# Patient Record
Sex: Female | Born: 1975 | Race: White | Hispanic: No | Marital: Single | Smoking: Current every day smoker
Health system: Southern US, Academic
[De-identification: ages and names within clinical notes are randomized; demographics above are authoritative.]

## PROBLEM LIST (undated history)

## (undated) DIAGNOSIS — J449 Chronic obstructive pulmonary disease, unspecified: Secondary | ICD-10-CM

## (undated) DIAGNOSIS — K219 Gastro-esophageal reflux disease without esophagitis: Secondary | ICD-10-CM

## (undated) HISTORY — DX: Gastro-esophageal reflux disease without esophagitis: K21.9

## (undated) HISTORY — DX: Chronic obstructive pulmonary disease, unspecified (CMS HCC): J44.9

---

## 2020-07-29 ENCOUNTER — Ambulatory Visit (INDEPENDENT_AMBULATORY_CARE_PROVIDER_SITE_OTHER): Payer: 59 | Admitting: Internal Medicine

## 2020-08-01 ENCOUNTER — Other Ambulatory Visit: Payer: Self-pay

## 2020-08-01 ENCOUNTER — Ambulatory Visit (INDEPENDENT_AMBULATORY_CARE_PROVIDER_SITE_OTHER): Payer: 59 | Admitting: Internal Medicine

## 2020-08-01 ENCOUNTER — Encounter (INDEPENDENT_AMBULATORY_CARE_PROVIDER_SITE_OTHER): Payer: Self-pay | Admitting: Internal Medicine

## 2020-08-01 VITALS — BP 132/84 | HR 101 | Temp 97.9°F | Ht 62.0 in | Wt 144.2 lb

## 2020-08-01 DIAGNOSIS — Z7689 Persons encountering health services in other specified circumstances: Secondary | ICD-10-CM

## 2020-08-01 DIAGNOSIS — Z716 Tobacco abuse counseling: Secondary | ICD-10-CM

## 2020-08-01 DIAGNOSIS — R9431 Abnormal electrocardiogram [ECG] [EKG]: Secondary | ICD-10-CM

## 2020-08-01 DIAGNOSIS — F5104 Psychophysiologic insomnia: Secondary | ICD-10-CM

## 2020-08-01 NOTE — Patient Instructions (Signed)
How to Quit Smoking  Smoking is a hard habit to break. About half of allpeople who have ever smoked have been able to quit. Most peoplewho still smoke want to quit. Here are some of the best ways to stop smoking.    Keep in mind the health benefits of quitting  The health benefits of quitting start right away. They keep improving the longer you go without smoking. Knowing this can help inspire you to stay on track. These benefits occur at any age. If you are 17 or 70, quitting is a good choice. Some of the health benefits after your last cigarette include:   20 minutes: Your blood pressure and pulse return to normal.   8 hours: Your oxygen levels return to normal.   2 days: Your ability to smell and taste start to improve as damaged nerves regrow.   2 to 3 weeks: Your circulation and lung function improve.   1 to 9 months: Your coughing, congestion, and shortness of breath decrease. Your tiredness decreases.   1 year: Your risk of heart attack decreases by half.   5 years: Your risk of lung cancer decreases by half. Your risk of stroke becomes the same as a nonsmoker's.  Go cold turkey  Mostformer smokers quit cold turkey. This means stopping all at once. Trying to cut back slowly often doesn't work as well. This may be because it continues the habit of smoking. Also, you may inhale more smoke while smoking fewer cigarettes. This leads to the same amount of nicotine in your body.  Get support  Support programs can be a big help, especially for heavy smokers. These groups offer lectures, ways to change behavior, and peer support. Here are some ways to find a support program:   Free national quitline 800-QUIT-NOW (800-784-8669)   Hospital quit-smoking programs   American Lung Association 800-586-4872   American Cancer Society 800-227-2345  Support at home is important too. Family and friends can offer praise and reassurance. If the smoker in your life finds it hard to quit, encourage them to keep  trying.  Try over-the-counter medicine  Nicotine replacement therapymay make iteasier to quit. Some aids are available without a prescription. These include a nicotine patch, gum, and lozenges. But itis best to use these under the care of your healthcare provider. The skin patch gives a steady supply of nicotine. Nicotine gum and lozenges giveshort-time doses of low levels of nicotine. Both methods reduce the craving for cigarettes. If youhave nausea, vomiting, dizziness, weakness, or a fast heartbeat, stop using these products. See your healthcare provider.  Ask about prescription medicine  After reviewingyour smoking patterns and past attempts to quit, your doctor may offer a prescription medicine such as bupropion, varenicline, a nicotine inhaler, or nasal spray. Each has advantages and side effects. Your doctor can review these with you.  Keep trying  Most smokers make many attempts at quitting before they are successful. It's important not to give up.  For more information  For more on how to quit smoking, try these online resources:   Go to Smokefree.gov.   Read "Clearing the Air" from the National Cancer Institute at smokefree.gov/sites/default/files/pdf/clearing-the-air-accessible.pdf.  Date Last Reviewed:    2000-2019 [employer] [address], [place] [address]. All rights reserved. This information is not intended as a substitute for professional medical care. Always follow your healthcare professional's instructions.

## 2020-08-01 NOTE — Progress Notes (Signed)
INTERNAL MEDICINE, [facility]  [place] [address]  [facility]     Name: Breanna Edwards MRN:  [id number]   Date: 08/01/2020 Age: 45 y.o.           OUTPATIENT PROGRESS NOTE    Subjective:   Patient ID:  [name] is a pleasant 45 y.o. female.    Chief Complaint: Establish Care, Insomnia, and Abnormal EKG      History of Present Illness:    Patient is here to establish care, new to [facility].  Previous primary care provider Dr. Helene Kelp [facility]).  Will request previous medical records and review upon receiving.     Abnormal EKG, found within the last month.  Patient denies chest pain or palpitations.  She states that the EKG was a screening test that was done prior to employment.  She reports that with the abnormality, her new employer requires that she gets additional testing done prior to starting her job training.  She states no prior history of heart disease, no heart attacks.  She also does have a history of breast implants and is wondering if this may be contributing to the abnormalities found on EKG    Insomnia  Chronic for over a year  Acute worsening over the last month or so regarding upcoming new employment and getting approval.  Currently on trazodone, tolerating well without significant side effects  Moderate severity  Trouble falling asleep  Improved by trazodone typically    She does smoke cigarettes, is currently smoking half pack a day and is trying to cut back.    The history is provided by the patient    Denies chest pain, palpitations, shortness of breath, wheezing      Allergies:     Allergies   Allergen Reactions    Aleve [Naproxen]     Flu Vaccine Qs2014-15(37mo,Up)     Pain Medicine [Diphenhydramine-Acetaminophen]      Any pain medicine       Pneumococcal Vaccine          Medications:   pantoprazole (PROTONIX) 40 mg Oral Tablet, Delayed Release (E.C.), Take 40 mg by mouth Once a day  traZODone (DESYREL) 100 mg Oral  Tablet, Take 100 mg by mouth    No facility-administered medications prior to visit.        Immunization History:     There is no immunization history on file for this patient.      Past Medical/Social/Family/Surgical History:       Past Medical History:   Diagnosis Date    Chronic obstructive airway disease (CMS HCC)     'beginning stages'    Esophageal reflux          Past Surgical History:   Procedure Laterality Date    HX BREAST AUGMENTATION      HX CHOLECYSTECTOMY           Family Medical History:     Problem Relation (Age of Onset)    Alzheimer's/Dementia Father    Congestive Heart Failure Maternal Grandmother    Diabetes Father    Heart Attack Father, Maternal Grandmother    Hypertension (High Blood Pressure) Father, Maternal Grandmother    Parkinsons Disease Father    Seizures Mother          Social History     Socioeconomic History    Marital status: Single   Tobacco Use    Smoking status: Current Every Day Smoker  Packs/day: 1.00     Years: 20.00     Pack years: 20.00     Types: Cigarettes    Smokeless tobacco: Never Used    Tobacco comment: cutting back (1/4 ppd as of 08/01/20)   Vaping Use    Vaping Use: Never used   Substance and Sexual Activity    Alcohol use: Yes     Comment: socially    Drug use: Never    Sexual activity: Not Currently     Partners: Male               Review of Systems:   cardiac: no chest pain, palpitations  Pulmonary: no cough or shortness of breath at rest  All other systems reviewed and negative unless otherwise noted in HPI.      Objective:   Vitals:    Vitals:    08/01/20 1501   BP: 132/84   Pulse: (!) 101   Temp: 36.6 C (97.9 F)   SpO2: 97%   Weight: 65.4 kg (144 lb 2.9 oz)   Height: 1.575 m (5\' 2" )   BMI: 26.43          Body mass index is 26.37 kg/m.    Wt Readings from Last 6 Encounters:   08/01/20 65.4 kg (144 lb 2.9 oz)         Physical Exam  Vitals and nursing note reviewed.   Constitutional:       General: She is not in acute distress.     Appearance:  She is not ill-appearing or diaphoretic.   HENT:      Head: Normocephalic and atraumatic.      Right Ear: External ear normal.      Left Ear: External ear normal.   Eyes:      General: No scleral icterus.        Right eye: No discharge.         Left eye: No discharge.      Extraocular Movements: Extraocular movements intact.   Neck:      Thyroid: No thyromegaly.      Vascular: No carotid bruit.   Cardiovascular:      Rate and Rhythm: Normal rate and regular rhythm.      Heart sounds: Normal heart sounds. No murmur heard.  Pulmonary:      Effort: Pulmonary effort is normal. No respiratory distress.      Breath sounds: Normal breath sounds. No wheezing.   Abdominal:      General: There is no distension.      Palpations: Abdomen is soft. There is no mass.      Tenderness: There is no abdominal tenderness. There is no guarding.   Musculoskeletal:         General: No swelling or signs of injury.      Cervical back: Neck supple.      Right lower leg: No edema.      Left lower leg: No edema.   Skin:     General: Skin is warm and dry.      Coloration: Skin is not jaundiced or pale.   Neurological:      Mental Status: She is alert.      Motor: No abnormal muscle tone.   Psychiatric:         Attention and Perception: She is attentive.         Mood and Affect: Mood is not depressed or elated. Affect is not blunt or flat.  Speech: Speech is not rapid and pressured.         Behavior: Behavior normal.             Assessment & Plan:     1. Encounter to establish care  Patient is here to establish care, new to [facility].  Previous primary care provider Dr. Helene Kelp [facility]).  Will request previous medical records and review upon receiving.    2. Abnormal EKG  Acute, no chest pain.  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 interval 118 milliseconds, QTC 389 milliseconds, T-wave abnormalities, not consistent with ACS  - TRANSTHORACIC ECHOCARDIOGRAM - ADULT; Future  - Refer to [facility]; Future    3. Psychophysiological insomnia  Acute on chronic, continue current medication, anxiety likely secondary to issues with pre-employment testing.  These will be resolved prior to the end of the month, continue to monitor for now    4. Encounter for tobacco use cessation counseling  Discussed the health risks of tobacco use with patient, including but not limited to: cancer, death, stroke, heart disease  If smoking cigarettes, discussed the health risks of second hand smoke and third hand smoke with patient, this does not apply to chewing tobacco  We discussed cessation and options to help with cessation: medications (chantix), tapering, abrupt cessation  We discussed the timeframe of quitting:  within 1-2 months, ASAP  Time spent 4 minutes    Patient understands all points discussed        For all new medications prescribed on this visit, side effects, alternatives, risks and benefits were discussed and all patient's questions were answered.            Health Maintenance   Topic Date Due    Covid-19 Vaccine (1) Never done    Pneumococcal Vaccine, Age 20-64 based on risk (1 of 2 - PPSV23) Never done    HIV Screening  Never done    Adult Tdap-Td (1 - Tdap) Never done    Pap smear  Never done    Colonoscopy  Never done    Influenza Vaccine (1) Never done    Depression Screening  08/01/2021       Return in about 4 months (around 12/01/2020), or if symptoms worsen or fail to improve.    The patient has been educated and verbalized understanding regarding the services provided during this visit.        Janalee Dane, DO  08/01/2020, 15:34    The date and time stamp above reflect the action of opening and starting the note and do not  reflect the visit start time, end time or visit length.    This note was created using dictation software.  It was not reviewed for spelling or grammar in errors.

## 2020-08-02 ENCOUNTER — Ambulatory Visit (INDEPENDENT_AMBULATORY_CARE_PROVIDER_SITE_OTHER): Payer: 59 | Admitting: CARDIOVASCULAR DISEASE

## 2020-08-02 ENCOUNTER — Encounter (INDEPENDENT_AMBULATORY_CARE_PROVIDER_SITE_OTHER): Payer: Self-pay | Admitting: Internal Medicine

## 2020-08-02 ENCOUNTER — Inpatient Hospital Stay (INDEPENDENT_AMBULATORY_CARE_PROVIDER_SITE_OTHER)
Admission: RE | Admit: 2020-08-02 | Discharge: 2020-08-02 | Disposition: A | Discharge status: Home / Self Care | Payer: 59 | Source: Ambulatory Visit

## 2020-08-02 ENCOUNTER — Encounter (INDEPENDENT_AMBULATORY_CARE_PROVIDER_SITE_OTHER): Payer: Self-pay | Admitting: CARDIOVASCULAR DISEASE

## 2020-08-02 ENCOUNTER — Other Ambulatory Visit: Payer: Self-pay

## 2020-08-02 DIAGNOSIS — R9431 Abnormal electrocardiogram [ECG] [EKG]: Secondary | ICD-10-CM

## 2020-08-02 LAB — TRANSTHORACIC ECHOCARDIOGRAM - ADULT
AV LVOT peak gradient: 5 mmHg
Ao peak vel: 142 cm/s
Aortic Valve Area by Continuity of Peak Velocity: 2.73 cm2
Aortic Valve Area by Continuity of VTI: 2.44 cm2
E wave decelartion time: 183 ms
E/A ratio: 1.7
Inferior Vena Cava Diameter: 1.1 cm
Inferior Vena Cava Diameter: 1.1 cm
LA Volume Index: 21.7 ml/m2
LA volume: 32.8 cm3
LVOT diameter: 2.1 cm
LVOT stroke volume: 78 cm3
Left Ventricular Outflow Tract Peak Velocity: 112 cm/s
MV Comp VTI: 26.2 cm
MV Peak A Vel: 44.1 cm/s
MV Peak E Vel: 76.6 cm/s
MV mean gradient: 2 mmHg
MV stenosis pressure 1/2 time: 54 ms
Mitral Valve Max Velocity: 96.1 cm/s
Pulmonic Valve Acceleration Time: 134 ms
RVDD: 2.5 cm
RVDD: 2.5 cm

## 2020-08-02 NOTE — Progress Notes (Signed)
[place]  [address]  [place] [address]  [facility]  Progress Note    Name: Breanna Edwards MRN:  [id number]   Date: 08/02/2020 Age: 45 y.o. Dec 17, 1975      Chief Complaint            Abnormal EKG           History of Present illness:  Breanna Edwards is a 45 y.o. female with a history of tobacco use well on the way to stopping without pharmacological intervention at this time had a new employee physical which included an EKG that had an abnormality presumably poor R-wave progression across her precordium due to previous breast implants.  An echocardiogram has been obtained today which was physically reviewed by myself and shows no abnormalities and is consistent with a normal exam for age.  Patient has no history of current or previous ischemic arrhythmic or congestive cardiovascular symptoms.  She has 2 brothers neither of which have heart disease.  The patient has never had diabetes hypertension or dyslipidemia.  She feels to be in very good health  There are no problems to display for this patient.       Past Medical History  Current Outpatient Medications   Medication Sig   . pantoprazole (PROTONIX) 40 mg Oral Tablet, Delayed Release (E.C.) Take 40 mg by mouth Once a day   . traZODone (DESYREL) 100 mg Oral Tablet Take 100 mg by mouth     Allergies   Allergen Reactions   . Aleve [Naproxen]    . Flu Vaccine Qs2014-15(17mo,Up)    . Pain Medicine [Diphenhydramine-Acetaminophen]      Any pain medicine      . Pneumococcal Vaccine      Past Medical History:   Diagnosis Date   . Chronic obstructive airway disease (CMS [facility])     'beginning stages'   . Esophageal reflux           Past Surgical History:   Procedure Laterality Date   . HX BREAST AUGMENTATION     . HX CHOLECYSTECTOMY           Family Medical History:     Problem Relation (Age of Onset)    Alzheimer's/Dementia Father    Congestive Heart Failure Maternal Grandmother    Diabetes  Father    Heart Attack Father, Maternal Grandmother    Hypertension (High Blood Pressure) Father, Maternal Grandmother    Parkinsons Disease Father    Seizures Mother          Social History     Socioeconomic History   . Marital status: Single   Tobacco Use   . Smoking status: Current Every Day Smoker     Packs/day: 0.25     Years: 20.00     Pack years: 5.00     Types: Cigarettes   . Smokeless tobacco: Never Used   . Tobacco comment: cutting back (1/4 ppd as of 08/01/20)   Vaping Use   . Vaping Use: Never used   Substance and Sexual Activity   . Alcohol use: Yes     Comment: socially   . Drug use: Never   . Sexual activity: Not Currently     Partners: Male        Review of Systems:  Constitutional: negative for fevers, chills, fatigue, or unintentional weight loss  Eyes: negative for visual disturbance, irritation or redness  Ears, nose, mouth, throat, and face:  negative for hearing loss, tinnitus or hoarseness  Respiratory: negative for cough, wheezing, or shortness of breath  Cardiovascular:   negative for chest pain, palpitations, or DVT  Gastrointestinal: negative for nausea, vomiting, diarrhea, or difficulty swallowing  Hematologic/lymphatic: negative for easy bruising or bleeding  Musculoskeletal:negative for myalgias, arthralgias or muscle weakness  Neurological: negative for headaches, seizures or speech problems; negative history of TIA or CVA  Behavioral/Psych: negative for sleep disturbance or anorexia  Endocrine: negative for temperature intolerance or excessive sweating  Allergic/Immunologic: negative for urticaria or angioedema      Physical Exam:  BP 112/64   Pulse 70   Temp 36.2 C (97.2 F) (Thermal Scan)   Ht 1.575 m (5\' 2" )   Wt 64.2 kg (141 lb 8.6 oz)   SpO2 98%   BMI 25.89 kg/m        GENERAL: Patient is alert oriented to time, place and person. No acute distress  HEENT:  Head is normocephalic, atraumatic.  Extraocular motions are intact.  Sclerae are nonicteric.   NECK:  Supple, without  JVD or carotid bruits.   LUNGS:  Breath sounds are clear to auscultation bilaterally.   CARDIOVASCULAR:  Reveals a regular rate and rhythm with normal S1 and S2. No murmur, rub or gallop present  ABDOMEN:  Soft and non-distended with no visceromegaly.  LOWER EXTREMITIES:  Appear well-perfused without edema.    Lab Results as reviewed by me today:  COMPLETE BLOOD COUNT   No results found for: WBC, HGB, HCT, PLTCNT, BANDS    DIFFERENTIAL  No results found for: PMNS, LYMPHOCYTES, MYELOCYTES, MONOCYTES, EOSINOPHIL, BASOPHILS, NRBCS, PMNABS, LYMPHSABS, EOSABS, MONOSABS, BASOSABS     COMPREHENSIVE METABOLIC PANEL - NON FASTING  No results found for: SODIUM, POTASSIUM, CHLORIDE, CO2, ANIONGAP, BUN, CREATININE, GLUCOSENF, CALCIUM, PHOSPHORUS, ALBUMIN, TOTALPROTEIN, ALKPHOS, AST, ALT, BILIRUBINCON    No results found for: TRIG, HDLCHOL, LDLCHOL, CHOLESTEROL  Lab A1C Results:  No results found for: HA1C    POCT A1C Results:  No flowsheet data found.    No results found for: POCTHA1C     TROPONIN I  No results found for: UHCEASTTROPI, TROPONINI       No results found for this or any previous visit (from the past [id number] hour(s)).     No results found for this or any previous visit (from the past [id number] hour(s)).     Cardiovascular Testing:    Assessment and Plan:    ICD-10-CM    1. Abnormal EKG  R94.31 Refer to [facility]        No orders of the defined types were placed in this encounter.      The patient who is stopping tobacco use does not appear to require any further cardiac testing at this time.  I re-emphasized the need to stop smoking.  Return if any symptoms develop.     Rolinda Roan, MD

## 2020-08-05 ENCOUNTER — Ambulatory Visit (INDEPENDENT_AMBULATORY_CARE_PROVIDER_SITE_OTHER): Payer: Self-pay | Admitting: Internal Medicine

## 2020-08-05 LAB — ECG 12 LEAD - (AMB USE ONLY)(MUSE, IN CLINIC)
Atrial Rate: 76 {beats}/min
Calculated P Axis: 35 degrees
Calculated R Axis: 59 degrees
Calculated T Axis: 55 degrees
PR Interval: 118 ms
QRS Duration: 82 ms
QT Interval: 346 ms
QTC Calculation: 389 ms
Ventricular rate: 76 {beats}/min

## 2020-08-05 NOTE — Telephone Encounter (Addendum)
I called the patient to see about the message below. She states that the head of HR for her new job was supposed to fax [place] forms for Dr. Haydee Monica to fill out. I informed her that we had not received anything gave her our fax number. Patient was going to request that her employer refax.    Salem Senate, RN  08/05/2020, 15:39      Regarding: call pt  ----- Message from Teodora Medici sent at 08/05/2020  3:21 PM EDT -----  [email or web address]    Pt asking if EKG was faxed

## 2020-08-08 ENCOUNTER — Encounter (INDEPENDENT_AMBULATORY_CARE_PROVIDER_SITE_OTHER): Payer: Self-pay | Admitting: Internal Medicine

## 2020-08-09 ENCOUNTER — Telehealth (INDEPENDENT_AMBULATORY_CARE_PROVIDER_SITE_OTHER): Payer: Self-pay | Admitting: Internal Medicine

## 2020-08-09 NOTE — Nursing Note (Signed)
Form completed. Should be in the fax bin.     Dr. Loletha Grayer         Form has been faxed to [phone] per pt request.  Orlinda Blalock, [facility]  08/09/2020, 13:59

## 2020-08-09 NOTE — Telephone Encounter (Signed)
-----   Message from Janalee Dane, DO sent at 08/09/2020  1:31 PM EDT -----  Regarding: RE: Employment document   Form completed. Should be in the fax bin.    Dr. Loletha Grayer  ----- Message -----  From: Orlinda Blalock, [place]: 08/08/2020   2:38 PM EDT  To: Janalee Dane, DO  Subject: FW: Employment document                          Form printed and on your desk.  Orlinda Blalock, [facility]  08/08/2020, 14:38    ----- Message -----  From: Ilona Sorrel  Sent: 08/08/2020   2:18 PM EDT  To: [facility] Clinical Support Staff  Subject: Employment document                              Please see attached form to be filled out. It is to be sent to [facility]. His fax is [phone]  Thank you so very much.

## 2020-08-10 ENCOUNTER — Encounter (INDEPENDENT_AMBULATORY_CARE_PROVIDER_SITE_OTHER): Payer: Self-pay

## 2020-12-01 ENCOUNTER — Encounter (INDEPENDENT_AMBULATORY_CARE_PROVIDER_SITE_OTHER): Payer: Self-pay | Admitting: Internal Medicine

## 2020-12-20 ENCOUNTER — Encounter (INDEPENDENT_AMBULATORY_CARE_PROVIDER_SITE_OTHER): Payer: Self-pay | Admitting: Internal Medicine

## 2020-12-20 ENCOUNTER — Ambulatory Visit (INDEPENDENT_AMBULATORY_CARE_PROVIDER_SITE_OTHER): Payer: 59 | Admitting: Internal Medicine

## 2020-12-20 ENCOUNTER — Other Ambulatory Visit: Payer: Self-pay

## 2020-12-20 VITALS — BP 128/77 | HR 84 | Temp 98.2°F | Ht 62.0 in | Wt 157.8 lb

## 2020-12-20 DIAGNOSIS — K219 Gastro-esophageal reflux disease without esophagitis: Secondary | ICD-10-CM

## 2020-12-20 DIAGNOSIS — Z716 Tobacco abuse counseling: Secondary | ICD-10-CM

## 2020-12-20 DIAGNOSIS — D229 Melanocytic nevi, unspecified: Secondary | ICD-10-CM

## 2020-12-20 DIAGNOSIS — G4700 Insomnia, unspecified: Secondary | ICD-10-CM

## 2020-12-20 DIAGNOSIS — F17210 Nicotine dependence, cigarettes, uncomplicated: Secondary | ICD-10-CM

## 2020-12-20 MED ORDER — NICOTINE 7 MG/24 HR DAILY TRANSDERMAL PATCH
7.0000 mg | MEDICATED_PATCH | Freq: Every day | TRANSDERMAL | 0 refills | Status: AC
Start: 2020-12-20 — End: 2021-01-19

## 2020-12-20 MED ORDER — BUPROPION HCL XL 150 MG 24 HR TABLET, EXTENDED RELEASE
ORAL_TABLET | ORAL | 0 refills | Status: AC
Start: 2020-12-20 — End: 2021-02-02

## 2020-12-20 MED ORDER — NICOTINE 14 MG/24 HR DAILY TRANSDERMAL PATCH
14.0000 mg | MEDICATED_PATCH | Freq: Every day | TRANSDERMAL | 0 refills | Status: AC
Start: 2020-12-20 — End: 2021-01-19

## 2020-12-20 MED ORDER — PANTOPRAZOLE 40 MG TABLET,DELAYED RELEASE
40.0000 mg | DELAYED_RELEASE_TABLET | Freq: Every day | ORAL | 3 refills | Status: AC
Start: 2020-12-20 — End: ?

## 2020-12-20 MED ORDER — TRAZODONE 100 MG TABLET
100.0000 mg | ORAL_TABLET | Freq: Every evening | ORAL | 3 refills | Status: AC
Start: 2020-12-20 — End: ?

## 2020-12-20 NOTE — Patient Instructions (Signed)
How to Quit Smoking  Smoking is a hard habit to break. About half of allpeople who have ever smoked have been able to quit. Most peoplewho still smoke want to quit. Here are some of the best ways to stop smoking.    Keep in mind the health benefits of quitting  The health benefits of quitting start right away. They keep improving the longer you go without smoking. Knowing this can help inspire you to stay on track. These benefits occur at any age. If you are 17 or 70, quitting is a good choice. Some of the health benefits after your last cigarette include:   20 minutes: Your blood pressure and pulse return to normal.   8 hours: Your oxygen levels return to normal.   2 days: Your ability to smell and taste start to improve as damaged nerves regrow.   2 to 3 weeks: Your circulation and lung function improve.   1 to 9 months: Your coughing, congestion, and shortness of breath decrease. Your tiredness decreases.   1 year: Your risk of heart attack decreases by half.   5 years: Your risk of lung cancer decreases by half. Your risk of stroke becomes the same as a nonsmoker's.  Go cold turkey  Mostformer smokers quit cold turkey. This means stopping all at once. Trying to cut back slowly often doesn't work as well. This may be because it continues the habit of smoking. Also, you may inhale more smoke while smoking fewer cigarettes. This leads to the same amount of nicotine in your body.  Get support  Support programs can be a big help, especially for heavy smokers. These groups offer lectures, ways to change behavior, and peer support. Here are some ways to find a support program:   Free national quitline 800-QUIT-NOW (800-784-8669)   Hospital quit-smoking programs   American Lung Association 800-586-4872   American Cancer Society 800-227-2345  Support at home is important too. Family and friends can offer praise and reassurance. If the smoker in your life finds it hard to quit, encourage them to keep  trying.  Try over-the-counter medicine  Nicotine replacement therapymay make iteasier to quit. Some aids are available without a prescription. These include a nicotine patch, gum, and lozenges. But itis best to use these under the care of your healthcare provider. The skin patch gives a steady supply of nicotine. Nicotine gum and lozenges giveshort-time doses of low levels of nicotine. Both methods reduce the craving for cigarettes. If youhave nausea, vomiting, dizziness, weakness, or a fast heartbeat, stop using these products. See your healthcare provider.  Ask about prescription medicine  After reviewingyour smoking patterns and past attempts to quit, your doctor may offer a prescription medicine such as bupropion, varenicline, a nicotine inhaler, or nasal spray. Each has advantages and side effects. Your doctor can review these with you.  Keep trying  Most smokers make many attempts at quitting before they are successful. It's important not to give up.  For more information  For more on how to quit smoking, try these online resources:   Go to Smokefree.gov.   Read "Clearing the Air" from the National Cancer Institute at smokefree.gov/sites/default/files/pdf/clearing-the-air-accessible.pdf.  Date Last Reviewed:    2000-2019 [employer] [address], [place] [address]. All rights reserved. This information is not intended as a substitute for professional medical care. Always follow your healthcare professional's instructions.

## 2020-12-20 NOTE — Progress Notes (Signed)
INTERNAL MEDICINE, [facility]  [place] [address]  [facility]     Name: Breanna Edwards MRN:  [id number]   Date: 12/20/2020 Age: 45 y.o.           OUTPATIENT PROGRESS NOTE    Subjective:   Patient ID:  [name] is a pleasant 45 y.o. female.    Chief Complaint: Follow Up      History of Present Illness:  Patient presents for follow-up on GERD, insomnia, skin mole and tobacco use cessation counseling    GERD - chronic for over a year  Well controlled on current medications  No acute exacerbation  Worse with eating late at night, spicy foods  Improved by sitting up after meals, avoiding triggering foods  Compliant with medications    Insomnia  Chronic >1 year  Well controlled on trazodone, tolerating without side effects    Skin mole  Right upper chest   Not painful  Has been growing slowly over 10 years per patients mother  No recent rapid growth  Not itching  Improved by nothing    She would like to quit smoking  Currently smoking 1 pack per day  In the past she had tried Chantix which made her emotionally unstable    The history is provided by the patient and mother    Denies chest pain, palpitations, shortness of breath, wheezing      Allergies:     Allergies   Allergen Reactions   . Aleve [Naproxen]    . Flu Vaccine Qs2014-15(45moUp)    . Pain Medicine [Diphenhydramine-Acetaminophen]      Any pain medicine      . Pneumococcal Vaccine          Medications:   pantoprazole (PROTONIX) 40 mg Oral Tablet, Delayed Release (E.C.), Take 40 mg by mouth Once a day  traZODone (DESYREL) 100 mg Oral Tablet, Take 100 mg by mouth    No facility-administered medications prior to visit.        Immunization History:     There is no immunization history on file for this patient.      Past Medical/Social/Family/Surgical History:       Past Medical History:   Diagnosis Date   . [facility]     '[facility]   . Esophageal reflux          Past  Surgical History:   Procedure Laterality Date   . HX BREAST AUGMENTATION     . HX CHOLECYSTECTOMY           Family Medical History:     Problem Relation (Age of Onset)    Alzheimer's/Dementia Father    Congestive Heart Failure Maternal Grandmother    Diabetes Father    Heart Attack Father, Maternal Grandmother    Hypertension (High Blood Pressure) Father, Maternal Grandmother    Parkinsons Disease Father    Seizures Mother          Social History     Socioeconomic History   . Marital status: Single   Tobacco Use   . Smoking status: Current Every Day Smoker     Packs/day: 0.25     Years: 20.00     Pack years: 5.00     Types: Cigarettes   . Smokeless tobacco: Never Used   . Tobacco comment: cutting back (1/4 ppd as of 08/01/20)   Vaping Use   . Vaping Use: Never used  Substance and Sexual Activity   . Alcohol use: Yes     Comment: socially   . Drug use: Never   . Sexual activity: Not Currently     Partners: Male               Review of Systems:   cardiac: no chest pain, palpitations  Pulmonary: no cough or shortness of breath at rest  All other systems reviewed and negative unless otherwise noted in HPI.      Objective:   Vitals:    Vitals:    12/20/20 1502   BP: 128/77   Pulse: 84   Temp: 36.8 C (98.2 F)   SpO2: 99%   Weight: 71.6 kg (157 lb 13.6 oz)   Height: 1.575 m ('5\' 2"'$ )   BMI: 28.93          Body mass index is 28.87 kg/m.    Wt Readings from Last 6 Encounters:   12/20/20 71.6 kg (157 lb 13.6 oz)   08/02/20 64.2 kg (141 lb 8.6 oz)   08/01/20 65.4 kg (144 lb 2.9 oz)         Physical Exam  Vitals and nursing note reviewed.   Constitutional:       General: She is not in acute distress.     Appearance: She is not ill-appearing, toxic-appearing or diaphoretic.   Eyes:      General: No scleral icterus.        Right eye: No discharge.         Left eye: No discharge.      Extraocular Movements: Extraocular movements intact.   Cardiovascular:      Rate and Rhythm: Normal rate and regular rhythm.      Heart sounds:  Normal heart sounds. No murmur heard.  Pulmonary:      Effort: Pulmonary effort is normal. No respiratory distress.      Breath sounds: Normal breath sounds. No wheezing.   Abdominal:      General: Bowel sounds are normal. There is no distension.      Palpations: Abdomen is soft.      Tenderness: There is no abdominal tenderness.   Skin:     General: Skin is warm and dry.      Findings: No erythema or rash.          Neurological:      Mental Status: She is alert.   Psychiatric:         Attention and Perception: She is attentive.         Mood and Affect: Mood is not anxious or depressed.         Speech: She is communicative.         Behavior: Behavior is not agitated.         Cognition and Memory: Cognition is not impaired.             Assessment & Plan:     1. Gastroesophageal reflux disease, unspecified whether esophagitis present  Chronic and stable  Continue current medication  Continue to monitor  - pantoprazole (PROTONIX) 40 mg Oral Tablet, Delayed Release (E.C.); Take 1 Tablet (40 mg total) by mouth Once a day  Dispense: 90 Tablet; Refill: 3    2. Insomnia, unspecified type  Chronic and stable  Continue current medication  Continue to monitor  - traZODone (DESYREL) 100 mg Oral Tablet; Take 1 Tablet (100 mg total) by mouth Every night  Dispense: 90 Tablet; Refill: 3  3. Benign skin mole  Chronic, slow growth, appears benign.  Continue to monitor.    4. Encounter for tobacco use cessation counseling  Discussed the health risks of tobacco use with patient, including but not limited to: cancer, death, stroke, heart disease  If smoking cigarettes, discussed the health risks of second hand smoke and third hand smoke with patient, this does not apply to chewing tobacco  We discussed cessation and options to help with cessation: medications (chantix), tapering, abrupt cessation  We discussed the timeframe of quitting:  within 1-2 months, ASAP  Time spent 11 minutes    Patient understands all points discussed    -  buPROPion (WELLBUTRIN XL) 150 mg extended release 24 hr tablet; Take 1 Tablet (150 mg total) by mouth Once a day for 14 days, THEN 2 Tablets (300 mg total) Once a day for 30 days.  Dispense: 74 Tablet; Refill: 0  - nicotine (NICODERM CQ) 14 mg/24 hr Transdermal Patch 24 hr; Place 1 Patch (14 mg total) on the skin Once a day for 30 days  Dispense: 30 Patch; Refill: 0  - nicotine (NICODERM CQ) 7 mg/24 hr Transdermal Patch 24 hr; Place 1 Patch (7 mg total) on the skin Once a day for 30 days  Dispense: 30 Patch; Refill: 0      For all new medications prescribed on this visit, side effects, alternatives, risks and benefits were discussed and all patient's questions were answered.            Health Maintenance   Topic Date Due   . Covid-19 Vaccine (1) Never done   . Pneumococcal Vaccine, Age 72-64 (1 - PCV) Never done   . HIV Screening  Never done   . Adult Tdap-Td (1 - Tdap) Never done   . Pap smear  Never done   . Colonoscopy  Never done   . Influenza Vaccine (1) 01/19/2021   . Depression Screening  08/01/2021   . Meningococcal Vaccine  Aged Out       Return in about 6 months (around 06/22/2021), or if symptoms worsen or fail to improve.    The patient has been educated and verbalized understanding regarding the services provided during this visit.        Janalee Dane, DO  12/20/2020, 15:09    The date and time stamp above reflect the action of opening and starting the note and do not reflect the visit start time, end time or visit length.    This note was created using dictation software.  It was not reviewed for spelling or grammar in errors.

## 2021-06-23 ENCOUNTER — Encounter (INDEPENDENT_AMBULATORY_CARE_PROVIDER_SITE_OTHER): Payer: Self-pay | Admitting: Internal Medicine

## 2021-07-25 ENCOUNTER — Telehealth: Payer: 59 | Admitting: Internal Medicine

## 2021-07-25 ENCOUNTER — Encounter (INDEPENDENT_AMBULATORY_CARE_PROVIDER_SITE_OTHER): Payer: Self-pay | Admitting: Internal Medicine

## 2021-07-25 DIAGNOSIS — A084 Viral intestinal infection, unspecified: Secondary | ICD-10-CM

## 2021-07-25 MED ORDER — ONDANSETRON 8 MG DISINTEGRATING TABLET
8.0000 mg | ORAL_TABLET | Freq: Three times a day (TID) | ORAL | 0 refills | Status: AC | PRN
Start: 2021-07-25 — End: ?

## 2021-07-25 NOTE — Progress Notes (Signed)
INTERNAL MEDICINE, [facility]  [address]  [place] [address]  [facility]  Video Visit     Name: Breanna Edwards  MRN: [id number]    Date: 07/25/2021  Age: 46 y.o.                            Patient's location: [place] [address]   Patient/family aware of provider location: Yes  Patient/family consent for video visit: Yes  Interview and observation performed by: Janalee Dane, DO    Chief Complaint: Nausea and Diarrhea    History of Present Illness:  Breanna Edwards is a 46 y.o. female acute issue of nausea and diarrhea    Nausea with vomiting and diarrhea started yesterday  Severe intensity  Felt hot, did not check temperature  Improved by nothing  sharp abdominal pain, diffuse, pain max 6/10  no new medications   No blood in stools    HPI per patient    Past Medical History:  She has a past medical history of Chronic obstructive airway disease (CMS HCC) and Esophageal reflux.    Past Surgical History:  She has a past surgical history that includes hx breast augmentation and hx gall bladder surgery/chole.    Problem List:  She has Esophageal reflux and Insomnia on their problem list.    Medications:  .  buPROPion  .  ondansetron  .  pantoprazole  .  traZODone     Review of Systems:  Cardiology no chest pain or palpitations  Pulmonary no wheezing or shortness of breath the rest  Full review of systems completed and negative unless otherwise noted in HPI      Observational Exam:   There were no vitals filed for this visit.        Vital signs are patient reported with the exception of respiratory rate which was observed.    Physical Exam  Constitutional:       General: She is not in acute distress.     Appearance: She is not ill-appearing, toxic-appearing or diaphoretic.   HENT:      Head: Normocephalic and atraumatic. Not macrocephalic and not microcephalic. No raccoon eyes, right periorbital erythema or left periorbital erythema.      Right Ear: External ear normal.       Left Ear: External ear normal.   Eyes:      General: No scleral icterus.        Right eye: No discharge.         Left eye: No discharge.      Extraocular Movements: Extraocular movements intact.   Neck:      Vascular: No JVD.      Trachea: No tracheal deviation.      Comments: No visible goiter  Pulmonary:      Effort: Pulmonary effort is normal. No respiratory distress.   Musculoskeletal:      Cervical back: Normal range of motion.   Skin:     Coloration: Skin is not jaundiced or pale.      Findings: No erythema, petechiae or rash.   Neurological:      Mental Status: She is alert.      Cranial Nerves: No dysarthria or facial asymmetry.      Motor: No tremor.   Psychiatric:         Attention and Perception: She is attentive.         Mood and Affect:  Mood is not anxious or depressed. Affect is not flat or inappropriate.         Behavior: Behavior is not agitated.         Judgment: Judgment is not impulsive.           Data Reviewed:   Last office note    Assessment/Plan:  1. Viral gastroenteritis  Acute, not improving, new prescription as below, continue to monitor  Rec OTC pedialyte and immodium  If pain increases to 8/10 or higher, proceed to the ER for immediate assessment  Work note provided  - ondansetron (ZOFRAN ODT) 8 mg Oral Tablet, Rapid Dissolve; Place 1 Tablet (8 mg total) under the tongue Every 8 hours as needed for Nausea/Vomiting  Dispense: 30 Tablet; Refill: 0        ICD-10-CM    1. Viral gastroenteritis  A08.4 ondansetron (ZOFRAN ODT) 8 mg Oral Tablet, Rapid Dissolve        Orders Placed This Encounter   . ondansetron (ZOFRAN ODT) 8 mg Oral Tablet, Rapid Dissolve     Follow Up:  PRN    Total time spent during direct conference with patient 5 mins, this does not reflect entire time spent during the encounter including but not limited to chart review and documentation. Had difficulties with audio, we connected via phone call for audio purposes as well      Janalee Dane, DO

## 2021-07-27 ENCOUNTER — Ambulatory Visit (INDEPENDENT_AMBULATORY_CARE_PROVIDER_SITE_OTHER): Payer: Self-pay | Admitting: Internal Medicine

## 2021-07-27 ENCOUNTER — Telehealth: Payer: 59 | Admitting: Internal Medicine

## 2021-07-27 VITALS — Resp 14 | Ht 62.0 in | Wt 157.0 lb

## 2021-07-27 DIAGNOSIS — R300 Dysuria: Secondary | ICD-10-CM

## 2021-07-27 MED ORDER — PHENAZOPYRIDINE 200 MG TABLET
200.0000 mg | ORAL_TABLET | Freq: Three times a day (TID) | ORAL | 0 refills | Status: AC
Start: 2021-07-27 — End: 2021-07-29

## 2021-07-27 NOTE — Telephone Encounter (Signed)
Pt agreeable to video visit for UTI symptoms. Scheduled for 1630.    Georgina Quint, RN  07/27/2021, 16:16

## 2021-07-27 NOTE — Telephone Encounter (Signed)
I left a vm for the patient to return my call and schedule a video visit with Dr. Haydee Monica for UTI symptoms.     Georgina Quint, RN  07/27/2021, 12:00

## 2021-07-27 NOTE — Telephone Encounter (Signed)
Regarding: return call  ----- Message from [facility] sent at 07/27/2021  4:02 PM EST -----  Georgina Quint, RN     Pt is return call

## 2021-07-27 NOTE — Telephone Encounter (Signed)
Regarding: rx  ----- Message from Lilia Argue sent at 07/27/2021  3:50 PM EST -----  Janalee Dane, DO    Pt already had video visit with Dr    Needs UTI antibiotic send to [facility] now    Please call to advise    Preferred Pharmacy     [id number] [place] [facility] [address] [place] [id number]   Phone: [phone]: [phone]   Hours: Not open 24 hours

## 2021-07-27 NOTE — Telephone Encounter (Signed)
I left a vm for the patient to return my call regarding need for video visit for UTI symptoms.     Pt had video visit on 3/7 for gastroenteritis. UTI symptoms not addressed during that visit. New video visit will need scheduled for this issue.     Georgina Quint, RN  07/27/2021, 15:57

## 2021-07-27 NOTE — Telephone Encounter (Signed)
Regarding: rx request  ----- Message from Lanetta Inch sent at 07/27/2021 10:47 AM EST -----  Janalee Dane, DO        Pt requesting an antibiotic for her UTI, symptoms - burning, pressure.  Please call the patient to advise.    Preferred Pharmacy     [id number] [place] [facility] [address]    [address] [place] [address]   Phone: [phone]: [phone]   Hours: Not open 24 hours

## 2021-07-28 ENCOUNTER — Other Ambulatory Visit: Payer: Self-pay

## 2021-07-28 ENCOUNTER — Ambulatory Visit: Payer: 59 | Attending: Internal Medicine | Admitting: Internal Medicine

## 2021-07-28 ENCOUNTER — Encounter (INDEPENDENT_AMBULATORY_CARE_PROVIDER_SITE_OTHER): Payer: Self-pay | Admitting: Internal Medicine

## 2021-07-28 ENCOUNTER — Other Ambulatory Visit (INDEPENDENT_AMBULATORY_CARE_PROVIDER_SITE_OTHER): Payer: 59

## 2021-07-28 DIAGNOSIS — R300 Dysuria: Secondary | ICD-10-CM | POA: Insufficient documentation

## 2021-07-28 NOTE — Progress Notes (Signed)
INTERNAL MEDICINE, [facility]  [address]  [place] [address]  [facility]  Video Visit     Name: Breanna Edwards  MRN: [id number]    Date: 07/27/2021  Age: 46 y.o.                            Patient's location: [place] [address]   Patient/family aware of provider location: Yes  Patient/family consent for video visit: Yes  Interview and observation performed by: Janalee Dane, DO    Chief Complaint: Bladder Pain    History of Present Illness:  Breanna Edwards is a 46 y.o. female     Patient presents with acute complaints of urinary symptoms    Symptoms started approx 1 day ago  Dysuria is burning sensation, moderate severity  Pain is located at the urethra and lower abdomen  Increased frequency and bladder pressure  Denies hematuria  Worsened by nothing  She has minimal access to a vehicle, states she is able to come in for testing tomorrow afternoon    HPI Per patient     Past Medical History:  She has a past medical history of Chronic obstructive airway disease (CMS HCC) and Esophageal reflux.    Past Surgical History:  She has a past surgical history that includes hx breast augmentation and hx gall bladder surgery/chole.    Problem List:  She has Esophageal reflux and Insomnia on their problem list.    Medications:  .  buPROPion  .  ondansetron  .  pantoprazole  .  phenazopyridine  .  traZODone     Review of Systems:  Cardiology no chest pain or palpitations  Pulmonary no wheezing or shortness of breath the rest  Full review of systems completed and negative unless otherwise noted in HPI    Observational Exam:   Vitals:    07/27/21 1624   Resp: 14   Weight: 71.2 kg (157 lb)   Height: 1.575 m ('5\' 2"'$ )   BMI: 28.78     Vital signs are patient reported with the exception of respiratory rate which was observed.    Physical Exam  Constitutional:       General: She is not in acute distress.     Appearance: She is not ill-appearing, toxic-appearing or diaphoretic.    HENT:      Head: Normocephalic and atraumatic. Not macrocephalic and not microcephalic. No raccoon eyes, right periorbital erythema or left periorbital erythema.      Right Ear: External ear normal.      Left Ear: External ear normal.   Eyes:      General: No scleral icterus.        Right eye: No discharge.         Left eye: No discharge.      Extraocular Movements: Extraocular movements intact.   Neck:      Vascular: No JVD.      Trachea: No tracheal deviation.      Comments: No visible goiter  Pulmonary:      Effort: Pulmonary effort is normal.   Musculoskeletal:      Cervical back: Normal range of motion.   Skin:     Coloration: Skin is not jaundiced or pale.      Findings: No erythema, petechiae or rash.   Neurological:      Mental Status: She is alert.      Cranial Nerves: No dysarthria or  facial asymmetry.      Motor: No tremor.   Psychiatric:         Attention and Perception: She is attentive.         Mood and Affect: Mood is not anxious or depressed. Affect is not flat or inappropriate.         Behavior: Behavior is not agitated.         Judgment: Judgment is not impulsive.           Data Reviewed:   Last office visit    Assessment/Plan:  1. Dysuria  Acute, not improving  Awaiting testing ordered as below  - POCT Urine dipstick  - URINE CULTURE; Future        ICD-10-CM    1. Dysuria  R30.0 POCT Urine dipstick     URINE CULTURE        Orders Placed This Encounter   . URINE CULTURE   . POCT Urine dipstick   . phenazopyridine (PYRIDIUM) 200 mg Oral Tablet     Follow Up:  PRN    Will establish with [facility] at follow up visit.          Janalee Dane, DO

## 2021-07-30 LAB — URINE CULTURE: URINE CULTURE: >100000 — AB

## 2021-07-31 ENCOUNTER — Other Ambulatory Visit (INDEPENDENT_AMBULATORY_CARE_PROVIDER_SITE_OTHER): Payer: Self-pay | Admitting: Internal Medicine

## 2021-07-31 ENCOUNTER — Encounter (INDEPENDENT_AMBULATORY_CARE_PROVIDER_SITE_OTHER): Payer: Self-pay | Admitting: Internal Medicine

## 2021-07-31 DIAGNOSIS — N390 Urinary tract infection, site not specified: Secondary | ICD-10-CM

## 2021-07-31 MED ORDER — SULFAMETHOXAZOLE 800 MG-TRIMETHOPRIM 160 MG TABLET
1.0000 | ORAL_TABLET | Freq: Two times a day (BID) | ORAL | 0 refills | Status: AC
Start: 2021-07-31 — End: 2021-08-03

## 2021-12-15 ENCOUNTER — Telehealth (INDEPENDENT_AMBULATORY_CARE_PROVIDER_SITE_OTHER): Payer: Self-pay | Admitting: Family

## 2021-12-15 NOTE — Telephone Encounter (Signed)
I attempted to call the patient to do a transition of care and see if she wanted to establish with one of our other providers such as Francee Gentile or Marzetta Merino with our [facility] Medicine department. I left her a vm to return our call. Salem Senate, RN, 12/15/2021 16:18

## 2022-04-19 IMAGING — MR ARTHROGRAM MRI LT SHOULDER
5 series · 40 of 40 positions shown · IV contrast (multihance)
Comparison: none

﻿Pertinent Hx:    Injury 02/06/2022.  Shoulder pain.
TECHNIQUE: Using sterile technique and local anesthesia and with fluoroscopic guidance, a 20-gauge needle was placed into the shoulder.  Approximately 15 cc of a dilute mixture of MultiHance mixed in normal saline were injected into the shoulder along with a small amount of [id number].  Fluoro time:  0.2 min. 

T1 and T2 weighted coronal oblique, T1 weighted sagittal oblique, and T1 and T2 weighted axial images of the shoulder were taken.

[Series 3: T1 · axial · 4.0mm · 0.50mm/px · z∈[-50,+53]mm · 8 of 22 slices shown]
[im 1/22]
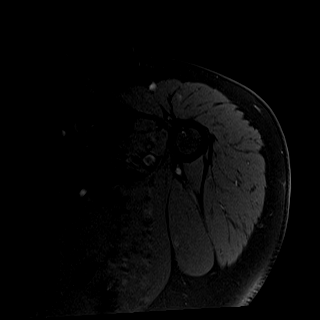
[im 4/22]
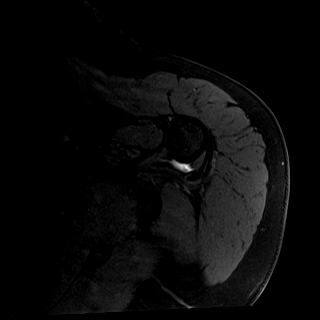
[im 7/22]
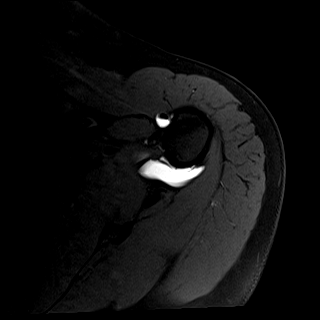
[im 10/22]
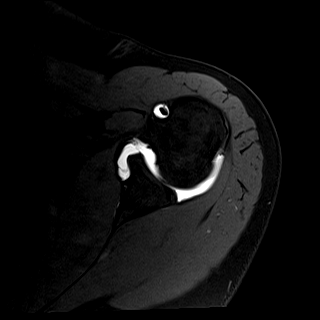
[im 13/22]
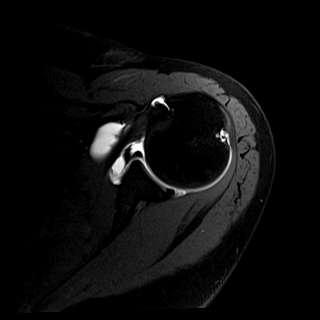
[im 16/22]
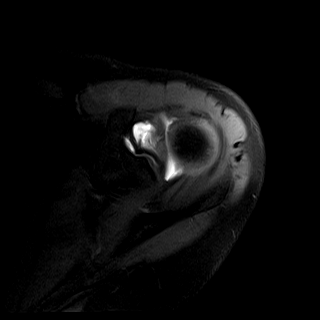
[im 19/22]
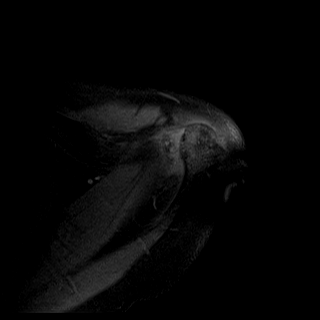
[im 22/22]
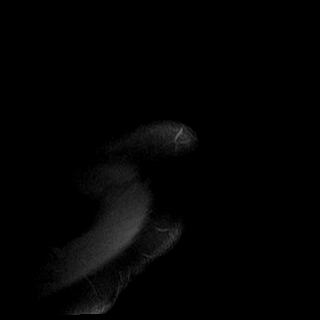

[Series 4: T1 fat-sat · oblique · 5.0mm · 0.50mm/px · 7 of 19 slices shown (1 of 2)]
[im 1/19]
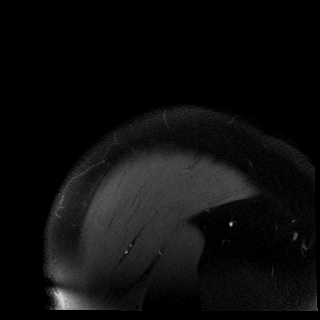
[im 4/19]
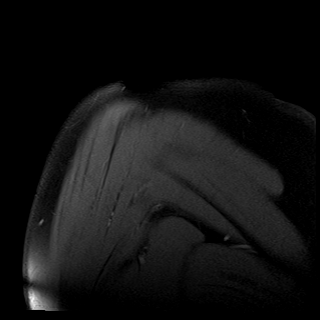
[im 7/19]
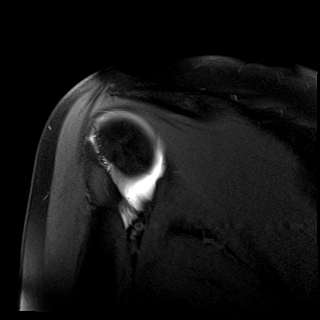
[im 10/19]
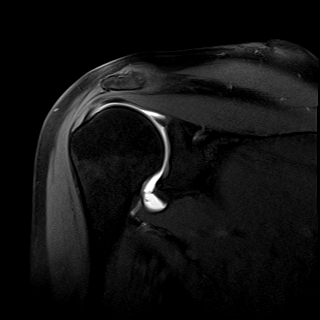
[im 13/19]
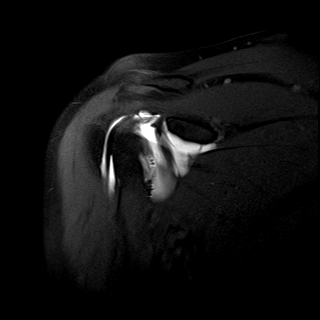
[im 16/19]
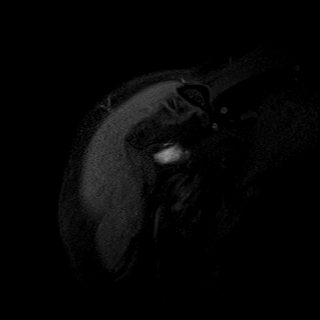
[im 19/19]
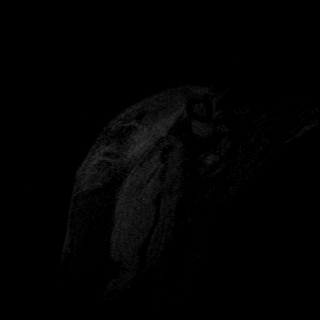

[Series 5: T2 fat-sat · oblique · 5.0mm · 0.50mm/px · 8 of 19 slices shown]
[im 1/19]
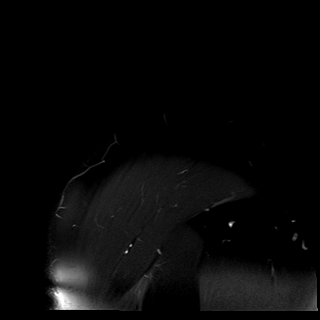
[im 3/19]
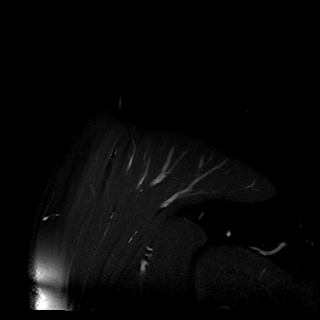
[im 6/19]
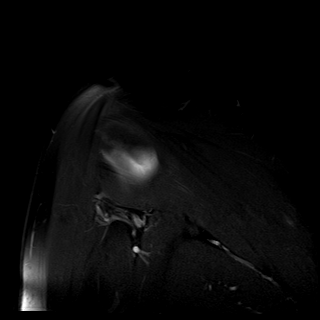
[im 8/19]
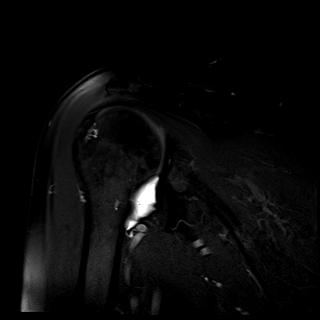
[im 11/19]
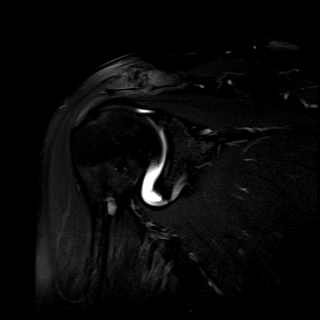
[im 13/19]
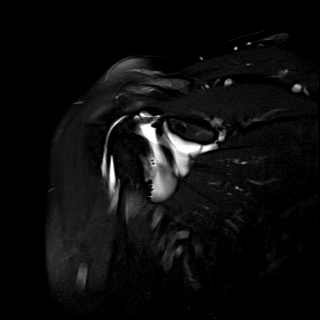
[im 16/19]
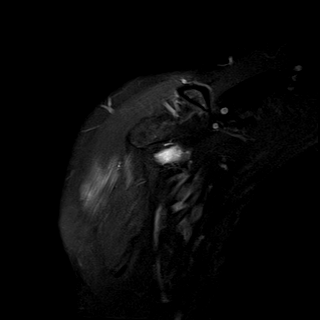
[im 19/19]
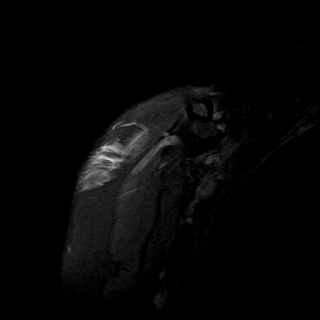

[Series 6: T2 · axial · 4.0mm · 0.50mm/px · z∈[-50,+53]mm · 9 of 22 slices shown]
[im 1/22]
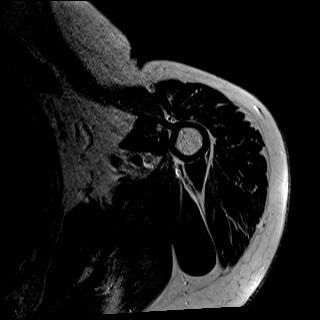
[im 3/22]
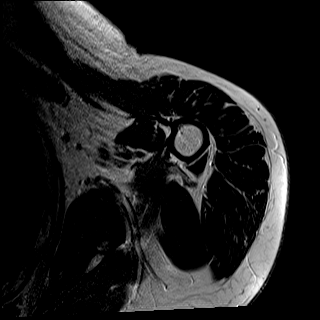
[im 6/22]
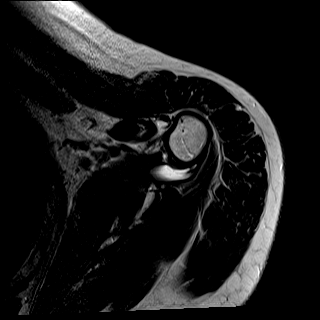
[im 8/22]
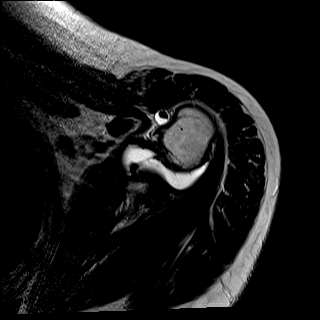
[im 11/22]
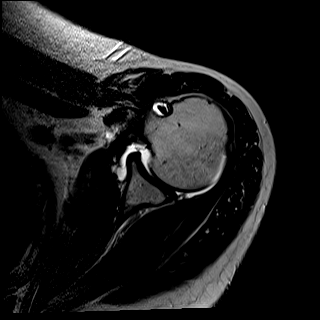
[im 14/22]
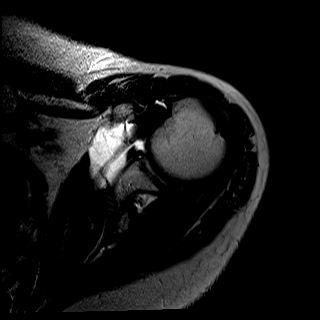
[im 16/22]
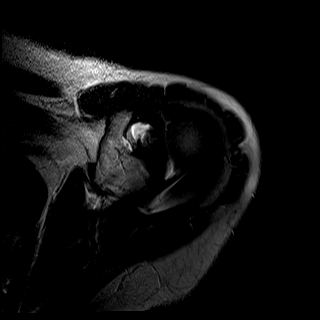
[im 19/22]
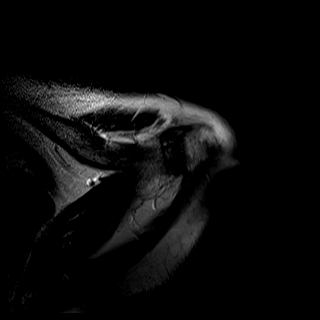
[im 22/22]
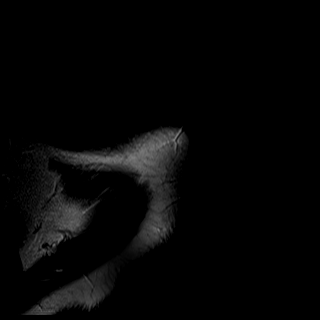

[Series 7: T1 fat-sat · oblique · 4.0mm · 0.50mm/px · 8 of 19 slices shown (2 of 2)]
[im 1/19]
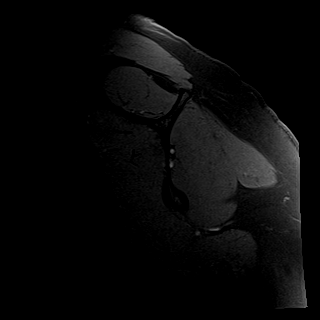
[im 3/19]
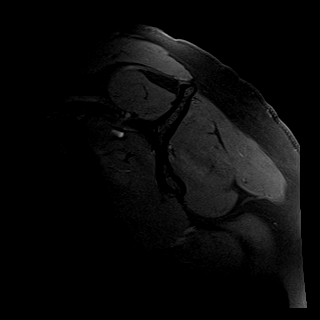
[im 6/19]
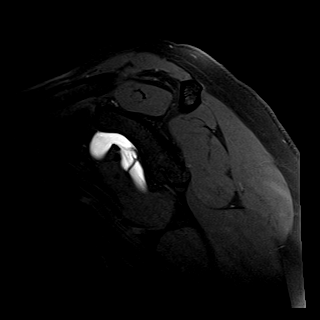
[im 8/19]
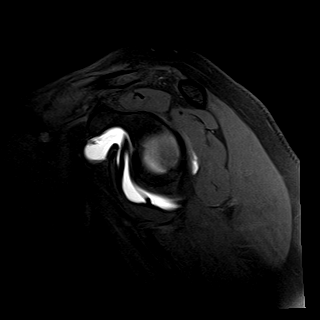
[im 11/19]
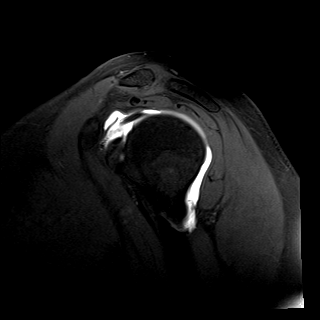
[im 13/19]
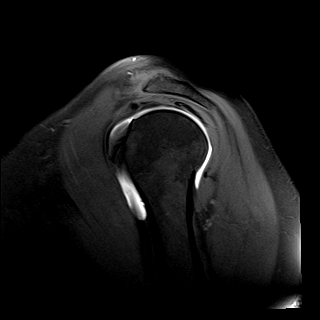
[im 16/19]
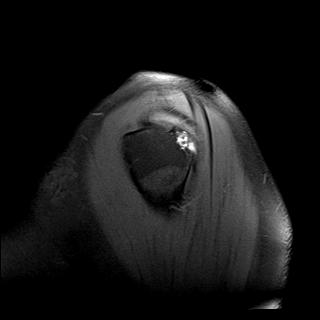
[im 19/19]
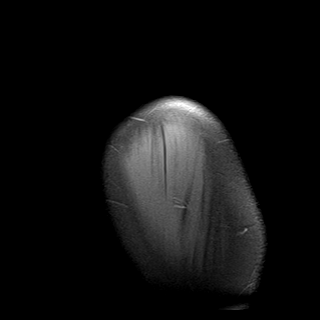

[40 of 40 positions shown; findings below may reference images not displayed]

FINDINGS: Rotator cuff tendons appear normal.  No tendon tear can be identified.  

No labral tear can be identified.  The biceps tendon is normal.  

No fracture.  Bone marrow signal appears normal.  

No chondral abnormality at the glenohumeral joint.  

There are acromioclavicular joint degenerative changes.  Small degenerative cystic changes are present in the humeral head posterolaterally.
IMPRESSION: Negative MRI arthrogram of the left shoulder.  No tear identified.

## 2022-04-19 IMAGING — MR MRI CERVICAL SPINE WITHOUT CONTRAST
6 series · 40 of 48 positions shown · non-contrast
Comparison: none

﻿

Pertinent Hx:    Injury 02/06/2022.  Neck and shoulder pain.
TECHNIQUE: T1 weighted, T2 weighted, and inversion recovery sagittal images of the cervical spine were taken.  T1 weighted, T2 weighted, and T2* axial images were also obtained.

[Series 2: T2 · sagittal · 3.0mm · 0.39mm/px · 6 of 15 slices shown (1 of 3)]
[im 1/15]
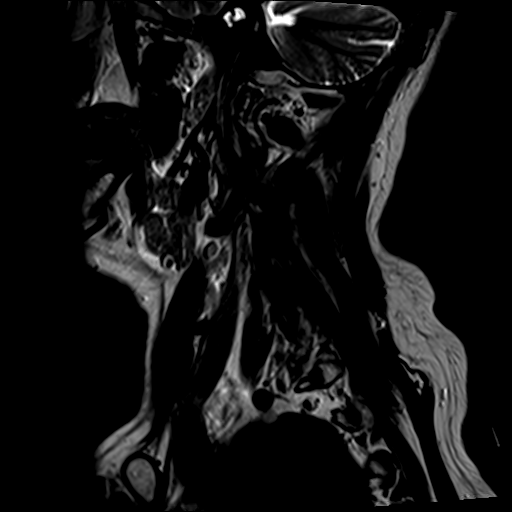
[im 3/15]
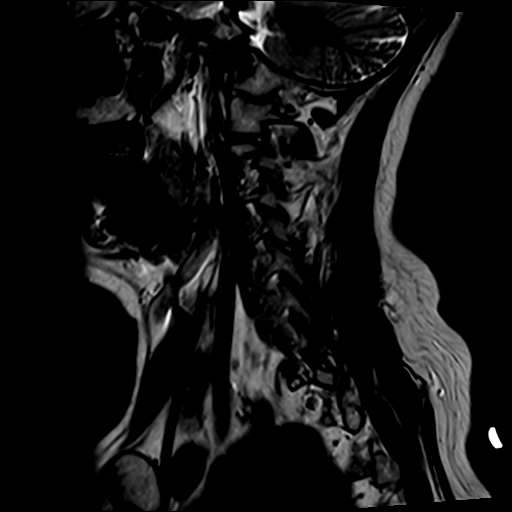
[im 6/15]
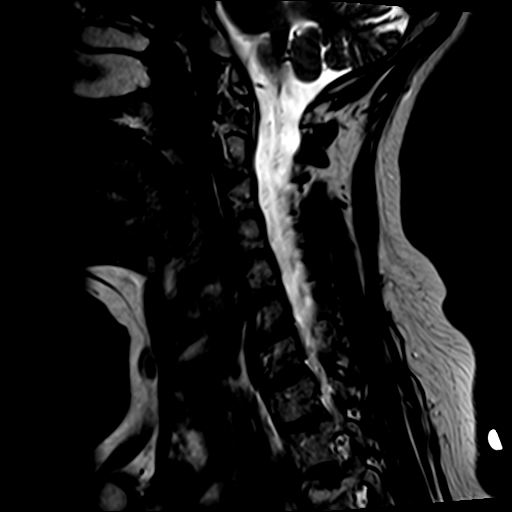
[im 9/15]
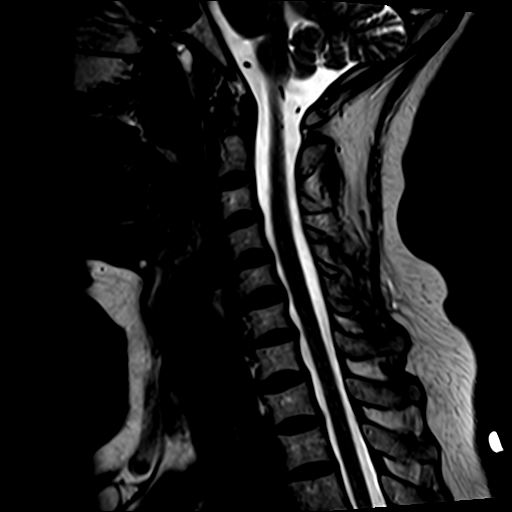
[im 12/15]
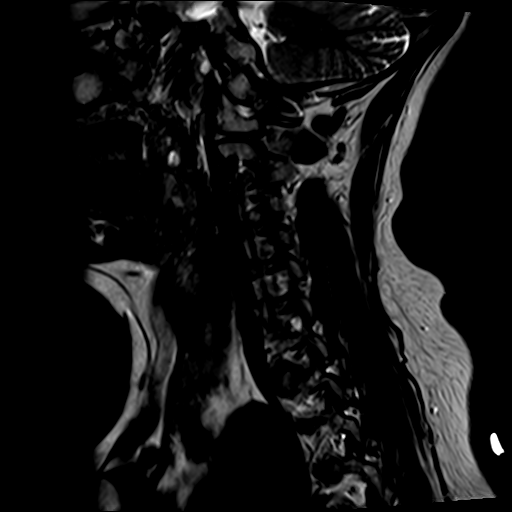
[im 15/15]
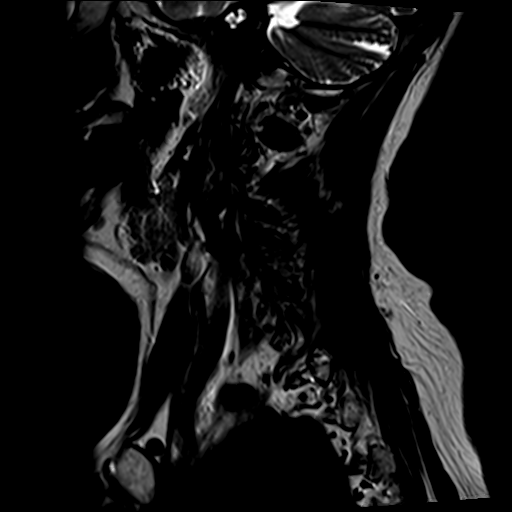

[Series 3: STIR · sagittal · 3.0mm · 0.39mm/px · 4 of 15 slices shown]
[im 1/15]
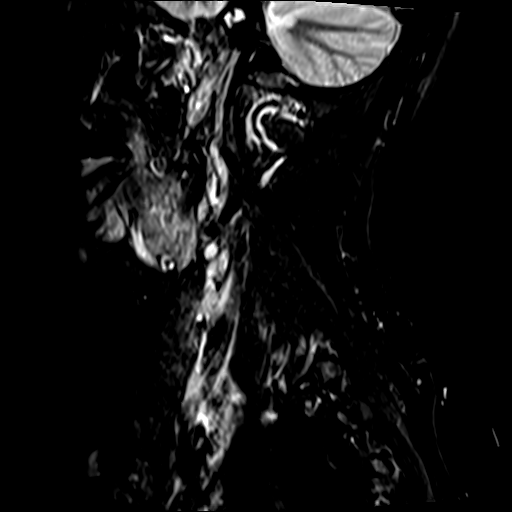
[im 3/15]
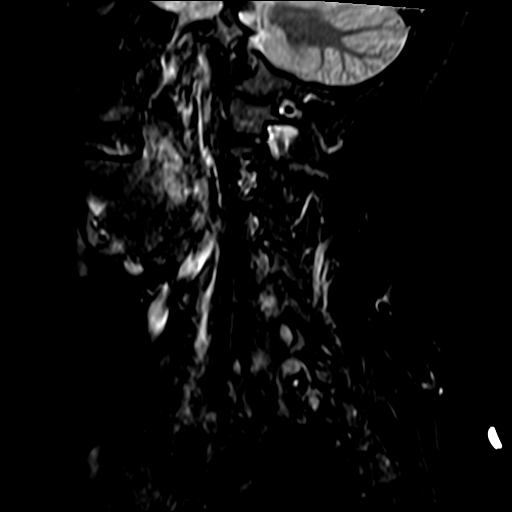
[im 6/15]
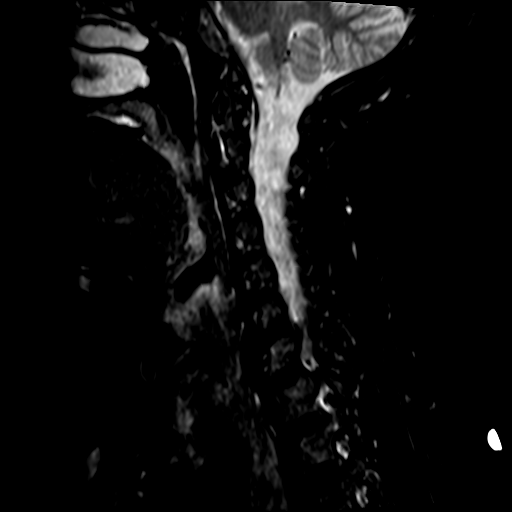
[im 9/15]
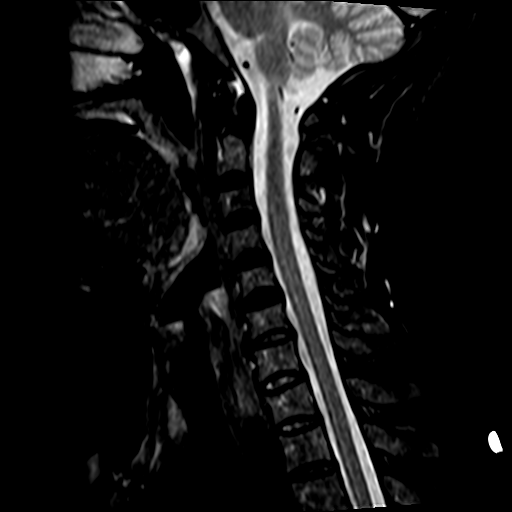

[Series 4: T2 · axial · 4.0mm · 0.62mm/px · z∈[-40,+61]mm · 8 of 22 slices shown (2 of 3)]
[im 1/22]
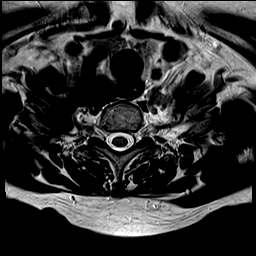
[im 3/22]
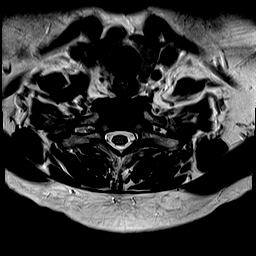
[im 8/22]
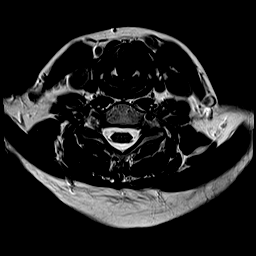
[im 10/22]
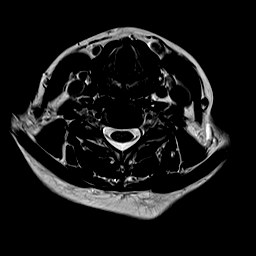
[im 12/22]
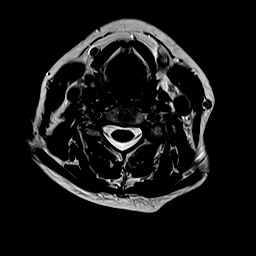
[im 15/22]
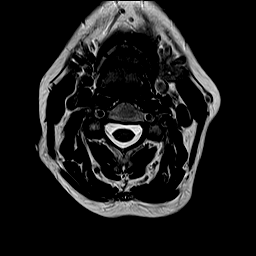
[im 19/22]
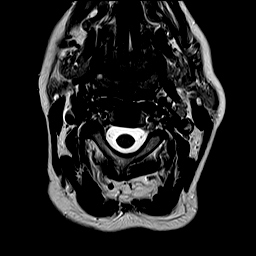
[im 22/22]
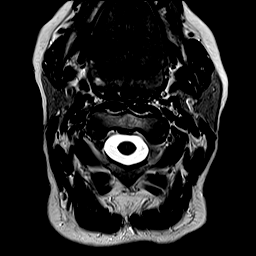

[Series 5: T2 · axial · 4.0mm · 0.62mm/px · z∈[-40,+61]mm · 8 of 22 slices shown (3 of 3)]
[im 1/22]
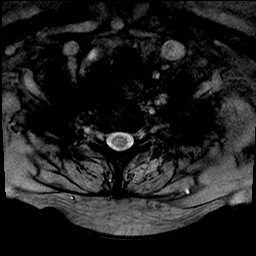
[im 3/22]
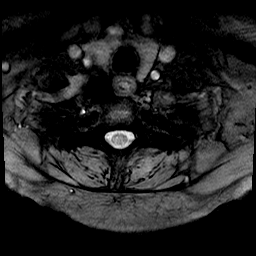
[im 8/22]
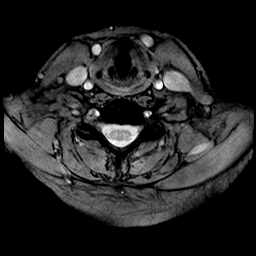
[im 10/22]
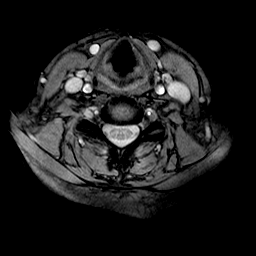
[im 12/22]
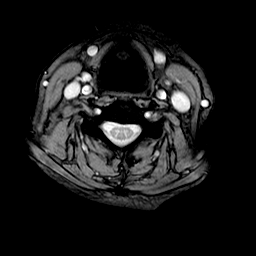
[im 15/22]
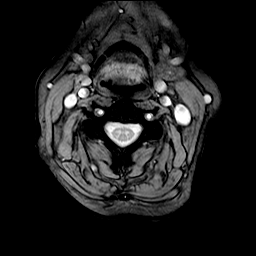
[im 19/22]
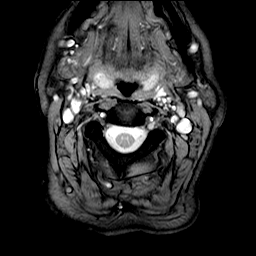
[im 22/22]
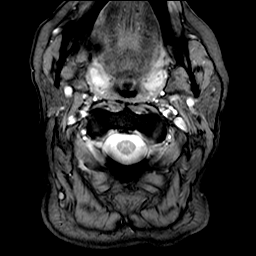

[Series 6: T1 · axial · 4.0mm · 0.62mm/px · z∈[-40,+61]mm · 8 of 22 slices shown (1 of 2)]
[im 1/22]
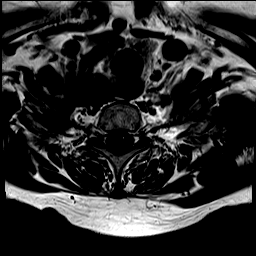
[im 3/22]
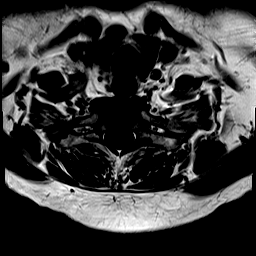
[im 8/22]
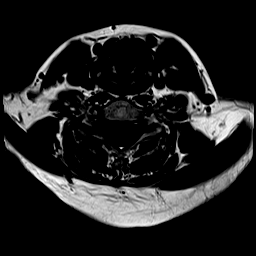
[im 10/22]
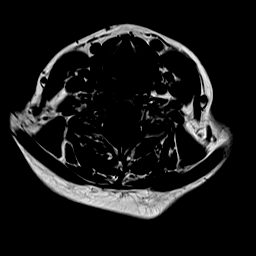
[im 12/22]
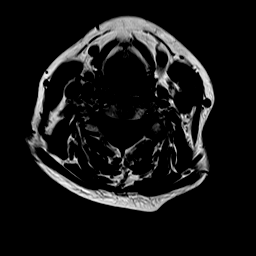
[im 15/22]
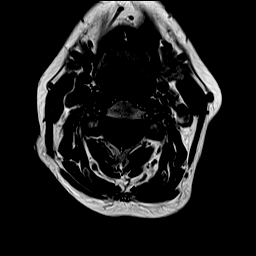
[im 19/22]
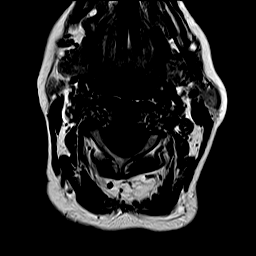
[im 22/22]
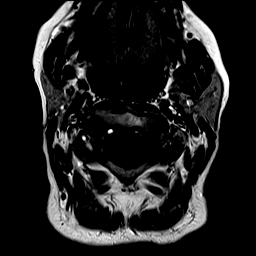

[Series 7: T1 · sagittal · 3.0mm · 0.78mm/px · 6 of 15 slices shown (2 of 2)]
[im 1/15]
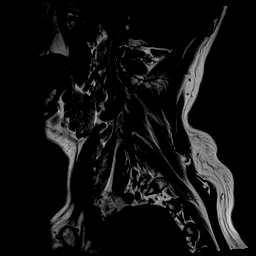
[im 3/15]
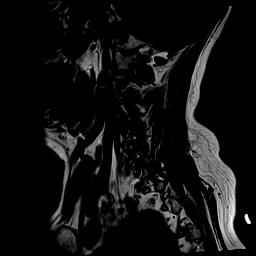
[im 6/15]
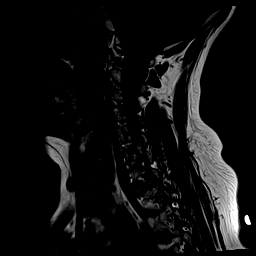
[im 9/15]
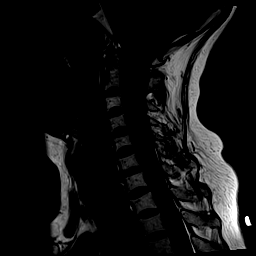
[im 12/15]
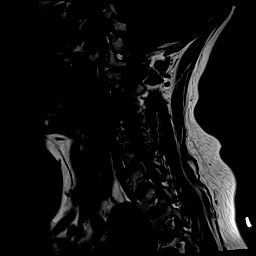
[im 15/15]
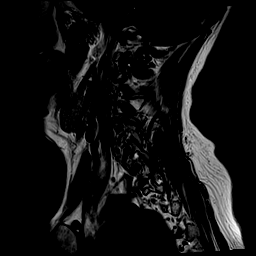

[40 of 48 positions shown; findings below may reference images not displayed]

FINDINGS: Alignment is normal.  

There is no fracture.

Cervical discs appear normal.  There is no disc protrusion or extrusion.  

There is no evidence of central canal spinal stenosis.  No foraminal stenosis identified.  

The spinal cord is normal.  

Paraspinal muscles appear normal.  

No abnormality identified in the neck.
IMPRESSION: Negative MRI of the cervical spine.  No abnormality identified.
